# Patient Record
Sex: Male | Born: 1995 | Hispanic: No | Marital: Single | Smoking: Never smoker
Health system: Southern US, Community
[De-identification: ages and names within clinical notes are randomized; demographics above are authoritative.]

## PROBLEM LIST (undated history)

## (undated) DIAGNOSIS — F32A Depression, unspecified: Secondary | ICD-10-CM

## (undated) DIAGNOSIS — F419 Anxiety disorder, unspecified: Secondary | ICD-10-CM

## (undated) DIAGNOSIS — F913 Oppositional defiant disorder: Secondary | ICD-10-CM

## (undated) DIAGNOSIS — F329 Major depressive disorder, single episode, unspecified: Secondary | ICD-10-CM

## (undated) DIAGNOSIS — K589 Irritable bowel syndrome without diarrhea: Secondary | ICD-10-CM

## (undated) HISTORY — DX: Oppositional defiant disorder: F91.3

## (undated) HISTORY — DX: Major depressive disorder, single episode, unspecified: F32.9

## (undated) HISTORY — DX: Depression, unspecified: F32.A

## (undated) HISTORY — DX: Anxiety disorder, unspecified: F41.9

## (undated) HISTORY — DX: Irritable bowel syndrome, unspecified: K58.9

---
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
## 2014-03-27 ENCOUNTER — Ambulatory Visit (INDEPENDENT_AMBULATORY_CARE_PROVIDER_SITE_OTHER): Payer: BC Managed Care – PPO | Admitting: Internal Medicine

## 2014-03-27 VITALS — BP 120/82 | HR 68 | Temp 98.2°F | Resp 16 | Ht 72.0 in | Wt 182.1 lb

## 2014-03-27 DIAGNOSIS — G4700 Insomnia, unspecified: Secondary | ICD-10-CM

## 2014-03-27 DIAGNOSIS — F411 Generalized anxiety disorder: Secondary | ICD-10-CM

## 2014-03-27 MED ORDER — CLONAZEPAM 1 MG PO TABS: 1.0000 mg | ORAL_TABLET | Freq: Every evening | ORAL | Status: DC | PRN

## 2014-03-27 NOTE — Progress Notes (Signed)
   Subjective:    Patient ID: Marvin Gentry, male    DOB: 1996-02-06, 18 y.o.   MRN: [id number]  Anxiety Symptoms include nervous/anxious behavior.     Chief Complaint  Patient presents with  . Anxiety  . Irritable Bowel Syndrome   This chart was scribed for Marvin Siaobert Merridy Pascoe, MD by Andrew Auaven Small, ED Scribe. This patient was seen in room 11 and the patient's care was started at 1:29 PM.  HPI Comments: Marvin SersKevin Lietzke is a 18 y.o. male who [facility] complaining of recurrent IBS symptoms. Pt currently attends [employer] with a heavy, intensive workload.  He reports recurrent IBS symptoms such as abdominal pain and loose stools. Pt reports associated anxiety but is unsure if IBS symptoms are the cause of anxiety or if the anxiety is causing stomach pain. Pt states in the past he would exercise to relieve stress but does not have time for this. He has also tried to change his diet which consist of processed fast food but due to his schedule his unable to cook for himself. Pt reports trouble sleeping due to stress and stomach pains. Pt states not being able to sleep is effecting his grades and performance in class.  Pt states bentyl in the past gave mild relief to pain but reports medication gave him sweats. Pt states he has tried cognitive exercises at night to help sleep such as breathing a tapping his feet.  Past Medical History  Diagnosis Date  . Anxiety   . Oppositional defiant disorder   . Depression   . IBS (irritable bowel syndrome)    History reviewed. No pertinent past surgical history. Prior to Admission medications   Not on File   Review of Systems  Gastrointestinal: Positive for abdominal pain and diarrhea.  Psychiatric/Behavioral: Positive for sleep disturbance. The patient is nervous/anxious.    Objective:   Physical Exam  Nursing note and vitals reviewed. Constitutional: He is oriented to person, place, and time. He appears well-developed and  well-nourished. No distress.  HENT:  Head: Normocephalic and atraumatic.  Right Ear: External ear normal.  Left Ear: External ear normal.  Eyes: Conjunctivae and EOM are normal. Pupils are equal, round, and reactive to light.  Neck: Normal range of motion. Neck supple.  Cardiovascular: Normal rate, regular rhythm and normal heart sounds.   Pulmonary/Chest: Effort normal and breath sounds normal.  Musculoskeletal: Normal range of motion.  Neurological: He is alert and oriented to person, place, and time.  Skin: Skin is warm and dry.  Psychiatric: He has a normal mood and affect. His behavior is normal.    Assessment & Plan:    Insomnia - Plan: clonazePAM (KLONOPIN) 1 MG tablet  GAD (generalized anxiety disorder)  Meds ordered this encounter  Medications  . clonazePAM (KLONOPIN) 1 MG tablet    Sig: Take 1 tablet (1 mg total) by mouth at bedtime as needed for anxiety.    Dispense:  30 tablet    Refill:  2   He would like to use medication briefly and hopes to not be on daily medication He will continue cognitive behavioral techniques Followup in December during semester break

## 2014-07-11 ENCOUNTER — Ambulatory Visit (INDEPENDENT_AMBULATORY_CARE_PROVIDER_SITE_OTHER): Payer: BC Managed Care – PPO | Admitting: Family Medicine

## 2014-07-11 VITALS — BP 118/68 | HR 84 | Temp 98.1°F | Resp 18 | Ht 71.5 in | Wt 171.0 lb

## 2014-07-11 DIAGNOSIS — F411 Generalized anxiety disorder: Secondary | ICD-10-CM

## 2014-07-11 DIAGNOSIS — K589 Irritable bowel syndrome without diarrhea: Secondary | ICD-10-CM

## 2014-07-11 DIAGNOSIS — F341 Dysthymic disorder: Secondary | ICD-10-CM

## 2014-07-11 DIAGNOSIS — G4700 Insomnia, unspecified: Secondary | ICD-10-CM

## 2014-07-11 DIAGNOSIS — S161XXA Strain of muscle, fascia and tendon at neck level, initial encounter: Secondary | ICD-10-CM

## 2014-07-11 MED ORDER — CLONAZEPAM 1 MG PO TABS: 1.0000 mg | ORAL_TABLET | Freq: Every day | ORAL | Status: AC

## 2014-07-11 MED ORDER — TRAMADOL HCL 50 MG PO TABS: 50.0000 mg | ORAL_TABLET | Freq: Three times a day (TID) | ORAL | Status: AC | PRN

## 2014-07-11 MED ORDER — METHOCARBAMOL 500 MG PO TABS: 500.0000 mg | ORAL_TABLET | Freq: Four times a day (QID) | ORAL | Status: AC

## 2014-07-11 NOTE — Progress Notes (Signed)
Subjective: Patient was lifting weights yesterday, doing a Network engineermilitary press. He felt a pop in her right side of his neck and intense pain it is persisted. He couldn't rest well last night with it. Has not had any prior neck injuries. The pain does not radiate down the arm, but he is in the right side of the neck and into the trapezius. He feels best if he tilts keeps his head tilted to the left.  He is also due a refill on his clonazepam. He takes this for a long time per Dr. Merla Richesoolittle. The controlled substances policy was explained to the patient, and he will be given one month's worth refill at this time today.

## 2014-07-11 NOTE — Patient Instructions (Addendum)
[facility] Policy for Prescribing Controlled Substances (Revised 04/2012) 1. Prescriptions for controlled substances will be filled by ONE provider at [facility] with whom you have established and developed a plan for your care, including follow-up. 2. You are encouraged to schedule an appointment with your prescriber at our appointment center for follow-up visits whenever possible. 3. If you request a prescription for the controlled substance while at [facility] for an acute problem (with someone other than your regular prescriber), you MAY be given a ONE-TIME prescription for a 30-day supply of the controlled substance, to allow time for you to return to see your regular prescriber for additional prescriptions.  Take Robaxin 1 or 2 pills morning and afternoon, and 2 at bedtime for muscle relaxant  Continue taking either ibuprofen 600-800 mg 3 times daily or naproxen (Aleve) 2 pills twice daily for pain and inflammation  Take tramadol 1 every 6 or 8 hours if needed for more severe pain  Ice/heat to neck as discussed  Gentle motion of neck to keep it from tightening up  Return if worse at anytime   .

## 2014-08-10 ENCOUNTER — Ambulatory Visit: Payer: BC Managed Care – PPO | Admitting: Internal Medicine

## 2015-05-15 ENCOUNTER — Encounter: Payer: Self-pay | Admitting: Internal Medicine

## 2020-12-25 ENCOUNTER — Other Ambulatory Visit: Payer: Self-pay

## 2020-12-25 ENCOUNTER — Encounter (HOSPITAL_BASED_OUTPATIENT_CLINIC_OR_DEPARTMENT_OTHER): Payer: Self-pay | Admitting: Obstetrics and Gynecology

## 2020-12-25 ENCOUNTER — Emergency Department (HOSPITAL_BASED_OUTPATIENT_CLINIC_OR_DEPARTMENT_OTHER): Payer: Managed Care, Other (non HMO)

## 2020-12-25 ENCOUNTER — Ambulatory Visit (HOSPITAL_BASED_OUTPATIENT_CLINIC_OR_DEPARTMENT_OTHER)
Admission: EM | Admit: 2020-12-25 | Discharge: 2020-12-26 | Disposition: A | Discharge status: Home / Self Care | Payer: Managed Care, Other (non HMO) | Attending: General Surgery | Admitting: General Surgery

## 2020-12-25 DIAGNOSIS — Z20822 Contact with and (suspected) exposure to covid-19: Secondary | ICD-10-CM | POA: Diagnosis not present

## 2020-12-25 DIAGNOSIS — K3580 Unspecified acute appendicitis: Secondary | ICD-10-CM | POA: Insufficient documentation

## 2020-12-25 DIAGNOSIS — Z79899 Other long term (current) drug therapy: Secondary | ICD-10-CM | POA: Insufficient documentation

## 2020-12-25 LAB — COMPREHENSIVE METABOLIC PANEL
ALT: 47 U/L — ABNORMAL HIGH (ref 0–44)
AST: 26 U/L (ref 15–41)
Albumin: 4.5 g/dL (ref 3.5–5.0)
Alkaline Phosphatase: 53 U/L (ref 38–126)
Anion gap: 11 (ref 5–15)
BUN: 16 mg/dL (ref 6–20)
CO2: 26 mmol/L (ref 22–32)
Calcium: 9.8 mg/dL (ref 8.9–10.3)
Chloride: 102 mmol/L (ref 98–111)
Creatinine, Ser: 1.24 mg/dL (ref 0.61–1.24)
GFR, Estimated: >60 mL/min (ref >60)
Glucose, Bld: 91 mg/dL (ref 70–99)
Potassium: 3.6 mmol/L (ref 3.5–5.1)
Sodium: 139 mmol/L (ref 135–145)
Total Bilirubin: 0.6 mg/dL (ref 0.3–1.2)
Total Protein: 7.2 g/dL (ref 6.5–8.1)

## 2020-12-25 LAB — CBC
HCT: 48.9 % (ref 39.0–52.0)
Hemoglobin: 17 g/dL (ref 13.0–17.0)
MCH: 31.3 pg (ref 26.0–34.0)
MCHC: 34.8 g/dL (ref 30.0–36.0)
MCV: 89.9 fL (ref 80.0–100.0)
Platelets: 266 10*3/uL (ref 150–400)
RBC: 5.44 MIL/uL (ref 4.22–5.81)
RDW: 11.8 % (ref 11.5–15.5)
WBC: 8.8 10*3/uL (ref 4.0–10.5)
nRBC: 0 % (ref 0.0–0.2)

## 2020-12-25 LAB — URINALYSIS, ROUTINE W REFLEX MICROSCOPIC
Bilirubin Urine: NEGATIVE
Glucose, UA: NEGATIVE mg/dL
Hgb urine dipstick: NEGATIVE
Ketones, ur: 40 mg/dL — AB
Leukocytes,Ua: NEGATIVE
Nitrite: NEGATIVE
Specific Gravity, Urine: 1.029 (ref 1.005–1.030)
pH: 6 (ref 5.0–8.0)

## 2020-12-25 LAB — LIPASE, BLOOD: Lipase: 45 U/L (ref 11–51)

## 2020-12-25 MED ORDER — SODIUM CHLORIDE 0.9 % IV BOLUS
1000.0000 mL | Freq: Once | INTRAVENOUS | Status: AC
  Administered 2020-12-25: 1000 mL via INTRAVENOUS

## 2020-12-25 MED ORDER — IOHEXOL 300 MG/ML  SOLN
100.0000 mL | Freq: Once | INTRAMUSCULAR | Status: AC | PRN
  Administered 2020-12-25: 100 mL via INTRAVENOUS

## 2020-12-25 MED ORDER — KETOROLAC TROMETHAMINE 30 MG/ML IJ SOLN
30.0000 mg | Freq: Once | INTRAMUSCULAR | Status: AC
  Administered 2020-12-25: 30 mg via INTRAVENOUS
  Filled 2020-12-25: qty 1

## 2020-12-25 NOTE — ED Triage Notes (Signed)
Patient reports to the ER from walk in clinic for r/o appendicitis.

## 2020-12-25 NOTE — ED Provider Notes (Signed)
[facility] EMERGENCY DEPT Provider Note   CSN: [id number] Arrival date & time: 12/25/20  1742     History Chief Complaint  Patient presents with   Abdominal Pain    Marvin Gentry is a 25 y.o. male.  Patient is a 25 year old male with history of irritable bowel.  Patient presents today for evaluation of abdominal pain.  This started 3 nights ago.  Pain began as what he describes as a "gas pain" to the right side of the abdomen that has not resolved.  He describes constant pain to the right lower quadrant that is worse when he pushes on the area and changes position.  He denies any fevers or chills.  He denies any bowel or bladder complaints.  He was seen at the doctor's office this morning, then sent here for rule out of appendicitis.  The history is provided by the patient.  Abdominal Pain Pain location:  RLQ Pain quality: aching   Pain radiates to:  Does not radiate Pain severity:  Moderate Onset quality:  Sudden Duration:  3 days Timing:  Constant Progression:  Worsening Chronicity:  New Relieved by:  Nothing Worsened by:  Movement, palpation and position changes     Past Medical History:  Diagnosis Date   Anxiety    Depression    IBS (irritable bowel syndrome)    Oppositional defiant disorder     Patient Active Problem List   Diagnosis Date Noted   IBS (irritable bowel syndrome) 06/21/2012   GAD (generalized anxiety disorder) 07/15/2011   Dysthymic disorder 07/15/2011    History reviewed. No pertinent surgical history.     Family History  Problem Relation Age of Onset   Anxiety disorder Father    Diabetes Paternal Grandmother     Social History   Tobacco Use   Smoking status: Never   Smokeless tobacco: Never  Vaping Use   Vaping Use: Never used  Substance Use Topics   Alcohol use: No   Drug use: No    Home Medications Prior to Admission medications   Medication Sig Start Date End Date Taking? Authorizing Provider  clonazePAM  (KLONOPIN) 1 MG tablet Take 1 tablet (1 mg total) by mouth daily. 07/11/14   Peyton Najjar, MD  methocarbamol (ROBAXIN) 500 MG tablet Take 1 tablet (500 mg total) by mouth 4 (four) times daily. 07/11/14   Peyton Najjar, MD  traMADol (ULTRAM) 50 MG tablet Take 1 tablet (50 mg total) by mouth every 8 (eight) hours as needed. 07/11/14   Peyton Najjar, MD    Allergies    Patient has no known allergies.  Review of Systems   Review of Systems  Gastrointestinal:  Positive for abdominal pain.  All other systems reviewed and are negative.  Physical Exam Updated Vital Signs BP (!) 143/89   Pulse 66   Temp 98.2 F (36.8 C)   Resp 18   Ht 6\' 2"  (1.88 m)   Wt 113.4 kg   SpO2 99%   BMI 32.10 kg/m   Physical Exam Vitals and nursing note reviewed.  Constitutional:      General: He is not in acute distress.    Appearance: He is well-developed. He is not diaphoretic.  HENT:     Head: Normocephalic and atraumatic.  Cardiovascular:     Rate and Rhythm: Normal rate and regular rhythm.     Heart sounds: No murmur heard.   No friction rub.  Pulmonary:     Effort: Pulmonary effort is  normal. No respiratory distress.     Breath sounds: Normal breath sounds. No wheezing or rales.  Abdominal:     General: Bowel sounds are normal. There is no distension.     Palpations: Abdomen is soft.     Tenderness: There is abdominal tenderness in the right lower quadrant. There is no right CVA tenderness, left CVA tenderness, guarding or rebound.  Musculoskeletal:        General: Normal range of motion.     Cervical back: Normal range of motion and neck supple.  Skin:    General: Skin is warm and dry.  Neurological:     Mental Status: He is alert and oriented to person, place, and time.     Coordination: Coordination normal.    ED Results / Procedures / Treatments   Labs (all labs ordered are listed, but only abnormal results are displayed) Labs Reviewed  COMPREHENSIVE METABOLIC PANEL -  Abnormal; Notable for the following components:      Result Value   ALT 47 (*)    All other components within normal limits  URINALYSIS, ROUTINE W REFLEX MICROSCOPIC - Abnormal; Notable for the following components:   Ketones, ur 40 (*)    Protein, ur TRACE (*)    All other components within normal limits  LIPASE, BLOOD  CBC    EKG None  Radiology No results found.  Procedures Procedures   Medications Ordered in ED Medications  ketorolac (TORADOL) 30 MG/ML injection 30 mg (has no administration in time range)  sodium chloride 0.9 % bolus 1,000 mL (has no administration in time range)    ED Course  I have reviewed the triage vital signs and the nursing notes.  Pertinent labs & imaging results that were available during my care of the patient were reviewed by me and considered in my medical decision making (see chart for details).    MDM Rules/Calculators/A&P  Patient sent for evaluation of right lower quadrant pain and concerns of appendicitis.  He has no white count or fever, but CT scan does show what appears to be acute, developing appendicitis.  Care discussed with Dr. Andrey Campanile from general surgery.  Patient to be transferred to [facility] for further evaluation.  Rocephin and Flagyl given.  Final Clinical Impression(s) / ED Diagnoses Final diagnoses:  None    Rx / DC Orders ED Discharge Orders     None        Geoffery Lyons, MD 12/26/20 [phone]

## 2020-12-26 ENCOUNTER — Emergency Department (HOSPITAL_COMMUNITY): Payer: Managed Care, Other (non HMO) | Admitting: Certified Registered"

## 2020-12-26 ENCOUNTER — Encounter (HOSPITAL_COMMUNITY)
Admission: EM | Disposition: A | Discharge status: Home / Self Care | Payer: Self-pay | Source: Home / Self Care | Attending: Emergency Medicine

## 2020-12-26 ENCOUNTER — Encounter (HOSPITAL_COMMUNITY): Payer: Self-pay

## 2020-12-26 DIAGNOSIS — K3580 Unspecified acute appendicitis: Secondary | ICD-10-CM | POA: Diagnosis present

## 2020-12-26 DIAGNOSIS — Z79899 Other long term (current) drug therapy: Secondary | ICD-10-CM | POA: Diagnosis not present

## 2020-12-26 DIAGNOSIS — Z20822 Contact with and (suspected) exposure to covid-19: Secondary | ICD-10-CM | POA: Diagnosis not present

## 2020-12-26 HISTORY — PX: LAPAROSCOPIC APPENDECTOMY: SHX408

## 2020-12-26 LAB — RESP PANEL BY RT-PCR (FLU A&B, COVID) ARPGX2
Influenza A by PCR: NEGATIVE
Influenza B by PCR: NEGATIVE
SARS Coronavirus 2 by RT PCR: NEGATIVE

## 2020-12-26 SURGERY — APPENDECTOMY, LAPAROSCOPIC
Anesthesia: General | Site: Abdomen

## 2020-12-26 MED ORDER — SUFENTANIL CITRATE 50 MCG/ML IV SOLN
INTRAVENOUS | Status: DC | PRN
  Administered 2020-12-26: 10 ug via INTRAVENOUS

## 2020-12-26 MED ORDER — OXYCODONE HCL 5 MG PO TABS
5.0000 mg | ORAL_TABLET | Freq: Once | ORAL | Status: AC | PRN
  Administered 2020-12-26: 5 mg via ORAL

## 2020-12-26 MED ORDER — ACETAMINOPHEN 10 MG/ML IV SOLN
INTRAVENOUS | Status: AC
  Filled 2020-12-26: qty 100

## 2020-12-26 MED ORDER — MIDAZOLAM HCL 2 MG/2ML IJ SOLN
INTRAMUSCULAR | Status: DC | PRN
  Administered 2020-12-26: 2 mg via INTRAVENOUS

## 2020-12-26 MED ORDER — ONDANSETRON HCL 4 MG/2ML IJ SOLN
INTRAMUSCULAR | Status: AC
  Filled 2020-12-26: qty 2

## 2020-12-26 MED ORDER — ACETAMINOPHEN 325 MG PO TABS: 650.0000 mg | ORAL_TABLET | ORAL | Status: DC | PRN

## 2020-12-26 MED ORDER — ROCURONIUM BROMIDE 10 MG/ML (PF) SYRINGE
PREFILLED_SYRINGE | INTRAVENOUS | Status: DC | PRN
  Administered 2020-12-26: 70 mg via INTRAVENOUS

## 2020-12-26 MED ORDER — PROPOFOL 10 MG/ML IV BOLUS
INTRAVENOUS | Status: DC | PRN
  Administered 2020-12-26: 200 mg via INTRAVENOUS

## 2020-12-26 MED ORDER — SODIUM CHLORIDE 0.9 % IV SOLN
1.0000 g | Freq: Once | INTRAVENOUS | Status: AC
  Administered 2020-12-26: 1 g via INTRAVENOUS
  Filled 2020-12-26: qty 10

## 2020-12-26 MED ORDER — AMISULPRIDE (ANTIEMETIC) 5 MG/2ML IV SOLN: 10.0000 mg | Freq: Once | INTRAVENOUS | Status: DC | PRN

## 2020-12-26 MED ORDER — PROPOFOL 10 MG/ML IV BOLUS
INTRAVENOUS | Status: AC
  Filled 2020-12-26: qty 20

## 2020-12-26 MED ORDER — MIDAZOLAM HCL 2 MG/2ML IJ SOLN
INTRAMUSCULAR | Status: AC
  Filled 2020-12-26: qty 2

## 2020-12-26 MED ORDER — OXYCODONE HCL 5 MG PO TABS: 5.0000 mg | ORAL_TABLET | Freq: Four times a day (QID) | ORAL | 0 refills | Status: AC | PRN

## 2020-12-26 MED ORDER — DEXAMETHASONE SODIUM PHOSPHATE 10 MG/ML IJ SOLN
INTRAMUSCULAR | Status: DC | PRN
  Administered 2020-12-26: 4 mg via INTRAVENOUS

## 2020-12-26 MED ORDER — SODIUM CHLORIDE 0.9 % IV SOLN: 250.0000 mL | INTRAVENOUS | Status: DC | PRN

## 2020-12-26 MED ORDER — SODIUM CHLORIDE 0.9 % IR SOLN
Status: DC | PRN
  Administered 2020-12-26: 1000 mL

## 2020-12-26 MED ORDER — CEFAZOLIN SODIUM-DEXTROSE 2-3 GM-%(50ML) IV SOLR
INTRAVENOUS | Status: DC | PRN
  Administered 2020-12-26: 2 g via INTRAVENOUS

## 2020-12-26 MED ORDER — ORAL CARE MOUTH RINSE: 15.0000 mL | Freq: Once | OROMUCOSAL | Status: AC

## 2020-12-26 MED ORDER — SUGAMMADEX SODIUM 200 MG/2ML IV SOLN
INTRAVENOUS | Status: DC | PRN
  Administered 2020-12-26: 200 mg via INTRAVENOUS

## 2020-12-26 MED ORDER — LACTATED RINGERS IV SOLN: INTRAVENOUS | Status: DC | PRN

## 2020-12-26 MED ORDER — OXYCODONE HCL 5 MG PO TABS
ORAL_TABLET | ORAL | Status: AC
  Filled 2020-12-26: qty 1

## 2020-12-26 MED ORDER — ONDANSETRON HCL 4 MG/2ML IJ SOLN
4.0000 mg | Freq: Once | INTRAMUSCULAR | Status: DC | PRN
Start: 2020-12-26 — End: 2020-12-26

## 2020-12-26 MED ORDER — FENTANYL CITRATE (PF) 100 MCG/2ML IJ SOLN
25.0000 ug | INTRAMUSCULAR | Status: DC | PRN
  Administered 2020-12-26: 50 ug via INTRAVENOUS

## 2020-12-26 MED ORDER — DEXAMETHASONE SODIUM PHOSPHATE 10 MG/ML IJ SOLN
INTRAMUSCULAR | Status: AC
  Filled 2020-12-26: qty 1

## 2020-12-26 MED ORDER — ACETAMINOPHEN 650 MG RE SUPP
650.0000 mg | RECTAL | Status: DC | PRN
Start: 2020-12-26 — End: 2020-12-26

## 2020-12-26 MED ORDER — CHLORHEXIDINE GLUCONATE 0.12 % MT SOLN
15.0000 mL | Freq: Once | OROMUCOSAL | Status: AC
  Administered 2020-12-26: 15 mL via OROMUCOSAL
  Filled 2020-12-26: qty 15

## 2020-12-26 MED ORDER — SUFENTANIL CITRATE 50 MCG/ML IV SOLN
INTRAVENOUS | Status: AC
  Filled 2020-12-26: qty 1

## 2020-12-26 MED ORDER — METRONIDAZOLE 500 MG/100ML IV SOLN
500.0000 mg | Freq: Once | INTRAVENOUS | Status: AC
  Administered 2020-12-26: 500 mg via INTRAVENOUS
  Filled 2020-12-26: qty 100

## 2020-12-26 MED ORDER — OXYCODONE HCL 5 MG/5ML PO SOLN: 5.0000 mg | Freq: Once | ORAL | Status: AC | PRN

## 2020-12-26 MED ORDER — LORAZEPAM 2 MG/ML IJ SOLN
1.0000 mg | Freq: Once | INTRAMUSCULAR | Status: AC
  Administered 2020-12-26: 1 mg via INTRAVENOUS
  Filled 2020-12-26: qty 1

## 2020-12-26 MED ORDER — ACETAMINOPHEN 10 MG/ML IV SOLN
INTRAVENOUS | Status: DC | PRN
  Administered 2020-12-26: 1000 mg via INTRAVENOUS

## 2020-12-26 MED ORDER — BUPIVACAINE-EPINEPHRINE (PF) 0.25% -1:200000 IJ SOLN
INTRAMUSCULAR | Status: AC
  Filled 2020-12-26: qty 30

## 2020-12-26 MED ORDER — 0.9 % SODIUM CHLORIDE (POUR BTL) OPTIME
TOPICAL | Status: DC | PRN
  Administered 2020-12-26: 1000 mL

## 2020-12-26 MED ORDER — ONDANSETRON HCL 4 MG/2ML IJ SOLN
INTRAMUSCULAR | Status: DC | PRN
  Administered 2020-12-26: 4 mg via INTRAVENOUS

## 2020-12-26 MED ORDER — SODIUM CHLORIDE 0.9% FLUSH: 3.0000 mL | INTRAVENOUS | Status: DC | PRN

## 2020-12-26 MED ORDER — OXYCODONE HCL 5 MG PO TABS: 5.0000 mg | ORAL_TABLET | ORAL | Status: DC | PRN

## 2020-12-26 MED ORDER — LIDOCAINE HCL (CARDIAC) PF 100 MG/5ML IV SOSY
PREFILLED_SYRINGE | INTRAVENOUS | Status: DC | PRN
  Administered 2020-12-26: 100 mg via INTRATRACHEAL

## 2020-12-26 MED ORDER — LACTATED RINGERS IV SOLN: INTRAVENOUS | Status: DC

## 2020-12-26 MED ORDER — FENTANYL CITRATE (PF) 100 MCG/2ML IJ SOLN
INTRAMUSCULAR | Status: AC
  Filled 2020-12-26: qty 2

## 2020-12-26 MED ORDER — CEFAZOLIN SODIUM-DEXTROSE 2-4 GM/100ML-% IV SOLN
INTRAVENOUS | Status: AC
  Filled 2020-12-26: qty 100

## 2020-12-26 MED ORDER — BUPIVACAINE-EPINEPHRINE 0.25% -1:200000 IJ SOLN
INTRAMUSCULAR | Status: DC | PRN
  Administered 2020-12-26: 5 mL

## 2020-12-26 SURGICAL SUPPLY — 41 items
ADH SKN CLS APL DERMABOND .7 (GAUZE/BANDAGES/DRESSINGS) ×1
APL PRP STRL LF DISP 70% ISPRP (MISCELLANEOUS) ×1
BAG COUNTER SPONGE SURGICOUNT (BAG) ×2 IMPLANT
BAG SPEC RTRVL 10 TROC 200 (ENDOMECHANICALS) ×1
BAG SPNG CNTER NS LX DISP (BAG) ×1
CANISTER SUCT 3000ML PPV (MISCELLANEOUS) ×2 IMPLANT
CHLORAPREP W/TINT 26 (MISCELLANEOUS) ×2 IMPLANT
COVER SURGICAL LIGHT HANDLE (MISCELLANEOUS) ×2 IMPLANT
CUTTER FLEX LINEAR 45M (STAPLE) ×2 IMPLANT
DERMABOND ADVANCED (GAUZE/BANDAGES/DRESSINGS) ×1
DERMABOND ADVANCED .7 DNX12 (GAUZE/BANDAGES/DRESSINGS) ×1 IMPLANT
ELECT REM PT RETURN 9FT ADLT (ELECTROSURGICAL) ×2
ELECTRODE REM PT RTRN 9FT ADLT (ELECTROSURGICAL) ×1 IMPLANT
GLOVE SURG ENC MOIS LTX SZ7 (GLOVE) ×2 IMPLANT
GLOVE SURG UNDER POLY LF SZ7.5 (GLOVE) ×2 IMPLANT
GOWN STRL REUS W/ TWL LRG LVL3 (GOWN DISPOSABLE) ×3 IMPLANT
GOWN STRL REUS W/TWL LRG LVL3 (GOWN DISPOSABLE) ×6
GRASPER SUT TROCAR 14GX15 (MISCELLANEOUS) ×2 IMPLANT
KIT BASIN OR (CUSTOM PROCEDURE TRAY) ×2 IMPLANT
KIT TURNOVER KIT B (KITS) ×2 IMPLANT
NS IRRIG 1000ML POUR BTL (IV SOLUTION) ×2 IMPLANT
PAD ARMBOARD 7.5X6 YLW CONV (MISCELLANEOUS) ×4 IMPLANT
POUCH RETRIEVAL ECOSAC 10 (ENDOMECHANICALS) ×1 IMPLANT
POUCH RETRIEVAL ECOSAC 10MM (ENDOMECHANICALS) ×2
RELOAD STAPLE 45 3.5 BLU ETS (ENDOMECHANICALS) IMPLANT
RELOAD STAPLE TA45 3.5 REG BLU (ENDOMECHANICALS) ×2 IMPLANT
SCISSORS LAP 5X35 DISP (ENDOMECHANICALS) ×1 IMPLANT
SET IRRIG TUBING LAPAROSCOPIC (IRRIGATION / IRRIGATOR) ×2 IMPLANT
SET TUBE SMOKE EVAC HIGH FLOW (TUBING) ×2 IMPLANT
SHEARS HARMONIC ACE PLUS 36CM (ENDOMECHANICALS) ×2 IMPLANT
SLEEVE ENDOPATH XCEL 5M (ENDOMECHANICALS) ×2 IMPLANT
SPECIMEN JAR SMALL (MISCELLANEOUS) ×2 IMPLANT
STRIP CLOSURE SKIN 1/2X4 (GAUZE/BANDAGES/DRESSINGS) ×2 IMPLANT
SUT MNCRL AB 4-0 PS2 18 (SUTURE) ×2 IMPLANT
SUT VICRYL 0 UR6 27IN ABS (SUTURE) ×2 IMPLANT
TOWEL GREEN STERILE (TOWEL DISPOSABLE) ×2 IMPLANT
TOWEL GREEN STERILE FF (TOWEL DISPOSABLE) ×2 IMPLANT
TRAY LAPAROSCOPIC MC (CUSTOM PROCEDURE TRAY) ×2 IMPLANT
TROCAR XCEL BLUNT TIP 100MML (ENDOMECHANICALS) ×2 IMPLANT
TROCAR XCEL NON-BLD 5MMX100MML (ENDOMECHANICALS) ×2 IMPLANT
WATER STERILE IRR 1000ML POUR (IV SOLUTION) ×2 IMPLANT

## 2020-12-26 NOTE — Transfer of Care (Signed)
Immediate Anesthesia Transfer of Care Note  Patient: Marvin Gentry  Procedure(s) Performed: APPENDECTOMY LAPAROSCOPIC (Abdomen)  Patient Location: PACU  Anesthesia Type:General  Level of Consciousness: awake, alert , oriented and patient cooperative  Airway & Oxygen Therapy: Patient Spontanous Breathing and Patient connected to nasal cannula oxygen  Post-op Assessment: Report given to RN, Post -op Vital signs reviewed and stable and Patient moving all extremities X 4  Post vital signs: Reviewed and stable  Last Vitals:  Vitals Value Taken Time  BP 134/75 12/26/20 1224  Temp    Pulse 82 12/26/20 1227  Resp 12 12/26/20 1227  SpO2 100 % 12/26/20 1227  Vitals shown include unvalidated device data.  Last Pain:  Vitals:   12/26/20 0956  TempSrc: Oral  PainSc:          Complications: No notable events documented.

## 2020-12-26 NOTE — Anesthesia Preprocedure Evaluation (Signed)
Anesthesia Evaluation  Patient identified by MRN, date of birth, ID band Patient awake    Reviewed: Allergy & Precautions, NPO status , Patient's Chart, lab work & pertinent test results  History of Anesthesia Complications Negative for: history of anesthetic complications  Airway Mallampati: II  TM Distance: >3 FB Neck ROM: Full    Dental  (+) Teeth Intact, Dental Advisory Given   Pulmonary neg pulmonary ROS,    Pulmonary exam normal        Cardiovascular negative cardio ROS Normal cardiovascular exam     Neuro/Psych Anxiety Depression negative neurological ROS     GI/Hepatic Neg liver ROS, Acute appendicitis   Endo/Other  negative endocrine ROS  Renal/GU negative Renal ROS  negative genitourinary   Musculoskeletal negative musculoskeletal ROS (+)   Abdominal   Peds  Hematology negative hematology ROS (+)   Anesthesia Other Findings   Reproductive/Obstetrics                             Anesthesia Physical Anesthesia Plan  ASA: 2 and emergent  Anesthesia Plan: General   Post-op Pain Management:    Induction: Intravenous, Rapid sequence and Cricoid pressure planned  PONV Risk Score and Plan: 3 and Ondansetron, Dexamethasone, Treatment may vary due to age or medical condition and Midazolam  Airway Management Planned: Oral ETT  Additional Equipment: None  Intra-op Plan:   Post-operative Plan: Extubation in OR  Informed Consent: I have reviewed the patients History and Physical, chart, labs and discussed the procedure including the risks, benefits and alternatives for the proposed anesthesia with the patient or authorized representative who has indicated his/her understanding and acceptance.     Dental advisory given  Plan Discussed with:   Anesthesia Plan Comments:         Anesthesia Quick Evaluation

## 2020-12-26 NOTE — Anesthesia Procedure Notes (Addendum)
Procedure Name: Intubation Date/Time: 12/26/2020 11:18 AM Performed by: Melina Schools, CRNA Pre-anesthesia Checklist: Patient identified, Emergency Drugs available, Suction available and Patient being monitored Patient Re-evaluated:Patient Re-evaluated prior to induction Oxygen Delivery Method: Circle System Utilized Preoxygenation: Pre-oxygenation with 100% oxygen Induction Type: IV induction Ventilation: Mask ventilation without difficulty Laryngoscope Size: Miller and 3 Grade View: Grade I Tube type: Oral Tube size: 7.5 mm Number of attempts: 1 Airway Equipment and Method: Stylet and Oral airway Placement Confirmation: ETT inserted through vocal cords under direct vision, positive ETCO2 and breath sounds checked- equal and bilateral Secured at: 22 cm Tube secured with: Tape Dental Injury: Teeth and Oropharynx as per pre-operative assessment

## 2020-12-26 NOTE — Anesthesia Postprocedure Evaluation (Signed)
Anesthesia Post Note  Patient: Marvin Gentry  Procedure(s) Performed: APPENDECTOMY LAPAROSCOPIC (Abdomen)     Patient location during evaluation: PACU Anesthesia Type: General Level of consciousness: awake and alert Pain management: pain level controlled Vital Signs Assessment: post-procedure vital signs reviewed and stable Respiratory status: spontaneous breathing, nonlabored ventilation and respiratory function stable Cardiovascular status: blood pressure returned to baseline and stable Postop Assessment: no apparent nausea or vomiting Anesthetic complications: no   No notable events documented.  Last Vitals:  Vitals:   12/26/20 1240 12/26/20 1255  BP: 126/76 128/71  Pulse: 83 76  Resp: 18 15  Temp:  36.6 C  SpO2: 99% 98%    Last Pain:  Vitals:   12/26/20 1255  TempSrc:   PainSc: 4                  Lucretia Kern

## 2020-12-26 NOTE — Op Note (Signed)
Preoperative diagnosis: acute appendicitis Postoperative diagnosis: Acute suppurative appendicitis Procedure: Laparoscopic appendectomy Surgeon: Dr. Harden Mo Anesthesia: General Estimated blood loss: Minimal Complications: None Drains: None Specimens: Appendix to pathology Sponge and count was correct completion Disposition recovery stable condition  Indications: This a 25 year old otherwise healthy male who presents with right lower quadrant pain, right lower quadrant tenderness, and a CT scan consistent with appendicitis.  I discussed options and we elected to proceed with laparoscopic appendectomy.  Procedure: After informed consent was obtained the patient was given antibiotics.  SCDs were placed.  He was placed under general anesthesia without complication.  He was prepped and draped in the standard sterile surgical fashion.  A surgical timeout was then performed.  I have treated Marcaine below his umbilicus.  I then made a vertical incision.  I grasped the fascia incised this sharply.  The peritoneum was entered bluntly.  I then placed a 0 Vicryl pursestring suture through the fascia.  I inserted a Hassan trocar and insufflated the abdomen to 15 mmHg pressure.  I then inserted 2 5 mm trochars in the suprapubic region and left lower quadrant.  I then was able to identify the appendix.  The terminal ileum and cecum were both identified.  The appendix did have acute appendicitis.  I then used the harmonic scalpel to release it from the sidewall.  I took the appendiceal mesentery with the harmonic scalpel.  I then got to the base and clearly visualize the cecum.  I used a GIA stapler to divide this at the base.  This was placed in a retrieval bag and removed from the abdomen.  Hemostasis was observed.  I then removed my Hassan trocar and tied my pursestring down.  I did place 2 additional 0 Vicryl sutures to completely obliterate the umbilical defect.  I then desufflated the abdomen and  remove the remaining trochars.  I closed these with 4-0 Monocryl and glue.  He tolerated this well was extubated transferred recovery stable.

## 2020-12-26 NOTE — Discharge Instructions (Signed)
[facility] LAPAROSCOPIC SURGERY: POST OP INSTRUCTIONS  Always review your discharge instruction sheet given to you by the facility where your surgery was performed. IF YOU HAVE DISABILITY OR FAMILY LEAVE FORMS, YOU MUST BRING THEM TO THE OFFICE FOR PROCESSING.   DO NOT GIVE THEM TO YOUR DOCTOR.  A prescription for pain medication may be given to you upon discharge.  Take your pain medication as prescribed, if needed.  If narcotic pain medicine is not needed, then you may take acetaminophen (Tylenol), naprosyn (Alleve), or ibuprofen (Advil) as needed. Take your usually prescribed medications unless otherwise directed. If you need a refill on your pain medication, please contact your pharmacy.  They will contact our office to request authorization. Prescriptions will not be filled after 5pm or on week-ends. You should follow a light diet the first few days after arrival home, such as soup and crackers, etc.  Be sure to include lots of fluids daily. Most patients will experience some swelling and bruising in the area of the incisions.  Ice packs will help.  Swelling and bruising can take several days to resolve.  It is common to experience some constipation if taking pain medication after surgery.  Increasing fluid intake and taking a stool softener (such as Colace) will usually help or prevent this problem from occurring.  A mild laxative (Milk of Magnesia or Miralax) should be taken according to package instructions if there are no bowel movements after 48 hours. Unless discharge instructions indicate otherwise, you may remove your bandages 48 hours after surgery, and you may shower at that time.  You may have steri-strips (small skin tapes) in place directly over the incision.  These strips should be left on the skin for 7-10 days.  If your surgeon used skin glue on the incision, you may shower in 24 hours.  The glue will flake off over the next  2-3 weeks.  Any sutures or staples will be removed at the office during your follow-up visit. ACTIVITIES:  You may resume regular (light) daily activities beginning the next day--such as daily self-care, walking, climbing stairs--gradually increasing activities as tolerated.  You may have sexual intercourse when it is comfortable.  Refrain from any heavy lifting or straining until approved by your doctor. You may drive when you are no longer taking prescription pain medication, you can comfortably wear a seatbelt, and you can safely maneuver your car and apply brakes. RETURN TO WORK:  __________________________________________________________ You should see your doctor in the office for a follow-up appointment approximately 2-3 weeks after your surgery.  Make sure that you call for this appointment within a day or two after you arrive home to insure a convenient appointment time. OTHER INSTRUCTIONS: __________________________________________________________________________________________________________________________ __________________________________________________________________________________________________________________________ WHEN TO CALL YOUR DOCTOR: Fever over 101.0 Inability to urinate Continued bleeding from incision. Increased pain, redness, or drainage from the incision. Increasing abdominal pain  The clinic staff is available to answer your questions during regular business hours.  Please don't hesitate to call and ask to speak to one of the nurses for clinical concerns.  If you have a medical emergency, go to the nearest emergency room or call 911.  A surgeon from [facility] is always on call at the hospital. [address], [address], [place], [place]  [address] ? [address] [address], [place],    [address] [phone] ? [phone] ? FAX [phone] Web site: [email or web address]  

## 2020-12-26 NOTE — H&P (Signed)
Marvin Gentry is an 25 y.o. male.   Chief Complaint: ab pain HPI: 51 yom with rlq abdominal pain that occurred last Friday night. Has not gone away, nothing making it better at home. No n/v.  Anorexia. No fevers. Having bowel function.  He has history of ibs and has endoscopies previously. Wbc is normal.  Ct shows a 1 cm appendix with some stranding present. He was transferred here for consideration of appendectomy.   Past Medical History:  Diagnosis Date   Anxiety    Depression    IBS (irritable bowel syndrome)    Oppositional defiant disorder     History reviewed. No pertinent surgical history.  Family History  Problem Relation Age of Onset   Anxiety disorder Father    Diabetes Paternal Grandmother    Social History:  reports that he has never smoked. He has never used smokeless tobacco. He reports that he does not drink alcohol and does not use drugs.  Allergies: No Known Allergies  Medications Prior to Admission  Medication Sig Dispense Refill   clonazePAM (KLONOPIN) 1 MG tablet Take 1 tablet (1 mg total) by mouth daily. 30 tablet 0   methocarbamol (ROBAXIN) 500 MG tablet Take 1 tablet (500 mg total) by mouth 4 (four) times daily. 40 tablet 0   traMADol (ULTRAM) 50 MG tablet Take 1 tablet (50 mg total) by mouth every 8 (eight) hours as needed. 20 tablet 0    Results for orders placed or performed during the hospital encounter of 12/25/20 (from the past 48 hour(s))  Urinalysis, Routine w reflex microscopic Urine, Clean Catch     Status: Abnormal   Collection Time: 12/25/20  6:21 PM  Result Value Ref Range   Color, Urine YELLOW YELLOW   APPearance CLEAR CLEAR   Specific Gravity, Urine 1.029 1.005 - 1.030   pH 6.0 5.0 - 8.0   Glucose, UA NEGATIVE NEGATIVE mg/dL   Hgb urine dipstick NEGATIVE NEGATIVE   Bilirubin Urine NEGATIVE NEGATIVE   Ketones, ur 40 (A) NEGATIVE mg/dL   Protein, ur TRACE (A) NEGATIVE mg/dL   Nitrite NEGATIVE NEGATIVE   Leukocytes,Ua NEGATIVE NEGATIVE     Comment: Performed at [facility], [address] [place], [place] [address]  Lipase, blood     Status: None   Collection Time: 12/25/20  6:22 PM  Result Value Ref Range   Lipase 45 11 - 51 U/L    Comment: Performed at [facility], [address] [place], [place] [address]  Comprehensive metabolic panel     Status: Abnormal   Collection Time: 12/25/20  6:22 PM  Result Value Ref Range   Sodium 139 135 - 145 mmol/L   Potassium 3.6 3.5 - 5.1 mmol/L   Chloride 102 98 - 111 mmol/L   CO2 26 22 - 32 mmol/L   Glucose, Bld 91 70 - 99 mg/dL    Comment: Glucose reference range applies only to samples taken after fasting for at least 8 hours.   BUN 16 6 - 20 mg/dL   Creatinine, Ser 1.76 0.61 - 1.24 mg/dL   Calcium 9.8 8.9 - 16.0 mg/dL   Total Protein 7.2 6.5 - 8.1 g/dL   Albumin 4.5 3.5 - 5.0 g/dL   AST 26 15 - 41 U/L   ALT 47 (H) 0 - 44 U/L   Alkaline Phosphatase 53 38 - 126 U/L   Total Bilirubin 0.6 0.3 - 1.2 mg/dL   GFR, Estimated >73 >71 mL/min    Comment: (  NOTE) Calculated using the CKD-EPI Creatinine Equation (2021)    Anion gap 11 5 - 15    Comment: Performed at [facility], [address], [place], [place] [address]  CBC     Status: None   Collection Time: 12/25/20  6:22 PM  Result Value Ref Range   WBC 8.8 4.0 - 10.5 K/uL   RBC 5.44 4.22 - 5.81 MIL/uL   Hemoglobin 17.0 13.0 - 17.0 g/dL   HCT 70.9 62.8 - 36.6 %   MCV 89.9 80.0 - 100.0 fL   MCH 31.3 26.0 - 34.0 pg   MCHC 34.8 30.0 - 36.0 g/dL   RDW 29.4 76.5 - 46.5 %   Platelets 266 150 - 400 K/uL   nRBC 0.0 0.0 - 0.2 %    Comment: Performed at [facility], [address] [place], [place] [address]  Resp Panel by RT-PCR (Flu A&B, Covid) Nasopharyngeal Swab     Status: None   Collection Time: 12/26/20  2:53 AM   Specimen: Nasopharyngeal Swab; Nasopharyngeal(NP) swabs in vial transport medium  Result Value Ref Range   SARS Coronavirus 2 by  RT PCR NEGATIVE NEGATIVE    Comment: (NOTE) SARS-CoV-2 target nucleic acids are NOT DETECTED.  The SARS-CoV-2 RNA is generally detectable in upper respiratory specimens during the acute phase of infection. The lowest concentration of SARS-CoV-2 viral copies this assay can detect is 138 copies/mL. A negative result does not preclude SARS-Cov-2 infection and should not be used as the sole basis for treatment or other patient management decisions. A negative result may occur with  improper specimen collection/handling, submission of specimen other than nasopharyngeal swab, presence of viral mutation(s) within the areas targeted by this assay, and inadequate number of viral copies(<138 copies/mL). A negative result must be combined with clinical observations, patient history, and epidemiological information. The expected result is Negative.  Fact Sheet for Patients:  BloggerCourse.com  Fact Sheet for Healthcare Providers:  SeriousBroker.it  This test is no t yet approved or cleared by the [place] FDA and  has been authorized for detection and/or diagnosis of SARS-CoV-2 by FDA under an Emergency Use Authorization (EUA). This EUA will remain  in effect (meaning this test can be used) for the duration of the COVID-19 declaration under Section 564(b)(1) of the Act, 21 U.S.C.section 360bbb-3(b)(1), unless the authorization is terminated  or revoked sooner.       Influenza A by PCR NEGATIVE NEGATIVE   Influenza B by PCR NEGATIVE NEGATIVE    Comment: (NOTE) The Xpert Xpress SARS-CoV-2/FLU/RSV plus assay is intended as an aid in the diagnosis of influenza from Nasopharyngeal swab specimens and should not be used as a sole basis for treatment. Nasal washings and aspirates are unacceptable for Xpert Xpress SARS-CoV-2/FLU/RSV testing.  Fact Sheet for Patients: BloggerCourse.com  Fact Sheet for Healthcare  Providers: SeriousBroker.it  This test is not yet approved or cleared by the [place] FDA and has been authorized for detection and/or diagnosis of SARS-CoV-2 by FDA under an Emergency Use Authorization (EUA). This EUA will remain in effect (meaning this test can be used) for the duration of the COVID-19 declaration under Section 564(b)(1) of the Act, 21 U.S.C. section 360bbb-3(b)(1), unless the authorization is terminated or revoked.  Performed at [facility], [address] [place], [place] [address]    CT ABDOMEN PELVIS W CONTRAST  Result Date: 12/26/2020 CLINICAL DATA:  25 year old male with abdominal pain. EXAM: CT ABDOMEN AND PELVIS WITH CONTRAST TECHNIQUE: Multidetector  CT imaging of the abdomen and pelvis was performed using the standard protocol following bolus administration of intravenous contrast. CONTRAST:  OMNIPAQUE IOHEXOL 300 MG/ML  SOLN COMPARISON:  Abdominal ultrasound 12/28/2009. FINDINGS: Lower chest: The visualized lung bases are clear. No intra-abdominal free air or free fluid. Hepatobiliary: Faint ill-defined subcentimeter right hepatic hypodense lesion is too small to characterize. The liver is otherwise unremarkable. No intrahepatic biliary ductal dilatation. The gallbladder is unremarkable. Pancreas: Unremarkable. No pancreatic ductal dilatation or surrounding inflammatory changes. Spleen: Normal in size without focal abnormality. Adrenals/Urinary Tract: The adrenal glands unremarkable. Kidneys, visualized ureters, and urinary bladder unremarkable. Stomach/Bowel: There is no bowel obstruction. The appendix is mildly thickened measuring approximately 1 cm in thickness. There is periappendiceal stranding. Findings most concerning for an early acute appendicitis. Clinical correlation recommended. No abscess or perforation. The appendix is located the right lower quadrant posterior to the cecum. Vascular/Lymphatic: The  abdominal aorta and IVC are unremarkable. No venous gas there is no adenopathy. Reproductive: The prostate and seminal vesicles are grossly. No pelvic mass. Other: Small fat containing umbilical hernia. Musculoskeletal: No acute or significant osseous findings. IMPRESSION: Findings most concerning for an early acute appendicitis. No abscess or perforation. Electronically Signed   By: Elgie Collard M.D.   On: 12/26/2020 00:21    Review of Systems  Gastrointestinal:  Positive for abdominal pain.  All other systems reviewed and are negative.  Blood pressure (!) 130/99, pulse 83, temperature 98.2 F (36.8 C), temperature source Oral, resp. rate 18, height 6\' 2"  (1.88 m), weight 113.4 kg, SpO2 98 %. Physical Exam Constitutional:      Appearance: He is well-developed.  HENT:     Head: Normocephalic and atraumatic.  Eyes:     General: No scleral icterus.    Pupils: Pupils are equal, round, and reactive to light.  Cardiovascular:     Rate and Rhythm: Normal rate and regular rhythm.  Pulmonary:     Effort: Pulmonary effort is normal.     Breath sounds: Normal breath sounds.  Abdominal:     General: Bowel sounds are normal. There is no distension.     Tenderness: There is abdominal tenderness in the right lower quadrant.  Skin:    General: Skin is warm.     Capillary Refill: Capillary refill takes less than 2 seconds.  Neurological:     General: No focal deficit present.     Mental Status: He is alert.     Assessment/Plan Acute appendicitis -he appears to have appendicitis clinically and radiologically although time course not completely normal.   -I recommended lap appy (not observation) and we discussed risks, recovery  -will proceed today and plan for dc home if all goes well  , MD 12/26/2020, 10:49 AM

## 2020-12-27 ENCOUNTER — Encounter (HOSPITAL_COMMUNITY): Payer: Self-pay | Admitting: General Surgery

## 2020-12-27 LAB — SURGICAL PATHOLOGY

## 2022-10-03 IMAGING — CT CT ABD-PELV W/ CM
2 of 4 series · 16 of 46 positions shown, 18 images · IV contrast (omnipaque)
Comparison: Abdominal ultrasound 12/28/2009.

CLINICAL DATA: 25-year-old male with abdominal pain.

EXAM:
CT ABDOMEN AND PELVIS WITH CONTRAST
TECHNIQUE: Multidetector CT imaging of the abdomen and pelvis was performed
using the standard protocol following bolus administration of
intravenous contrast.
CONTRAST:  100mL OMNIPAQUE IOHEXOL 300 MG/ML  SOLN

[Series 2: abd pel w · axial · 0.83mm/px · z∈[-1012,-557]mm · 13 of 101 slices shown, 15 images]
[im 5/101  soft-tissue]
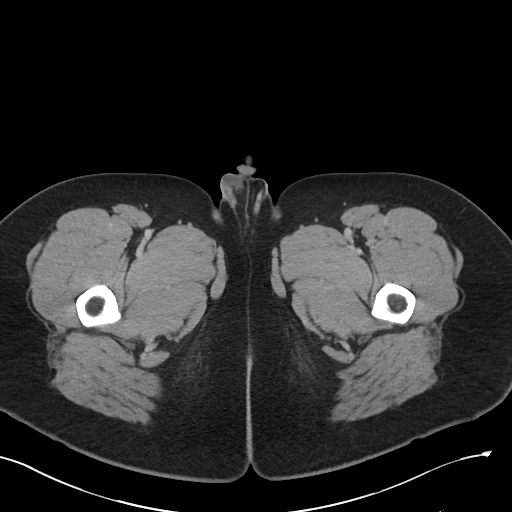
[im 5/101  bone]
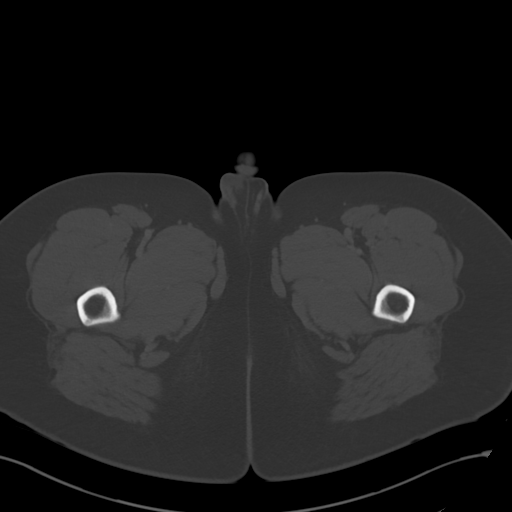
[im 14/101  soft-tissue]
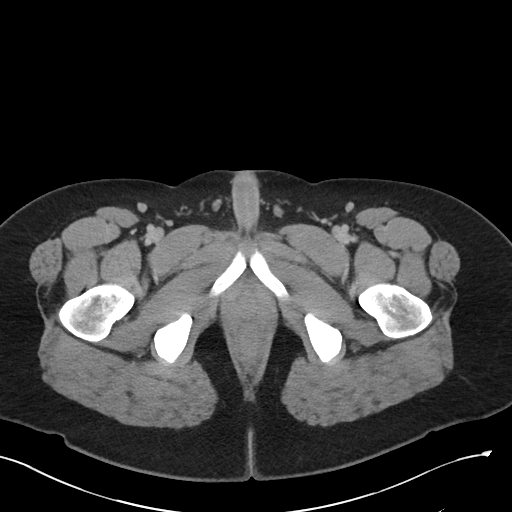
[im 22/101  soft-tissue]
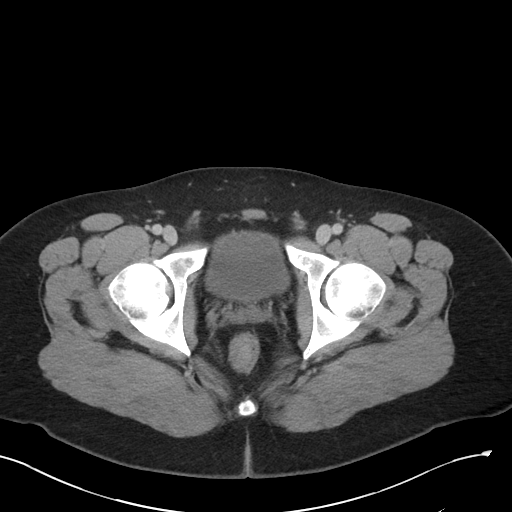
[im 27/101  soft-tissue]
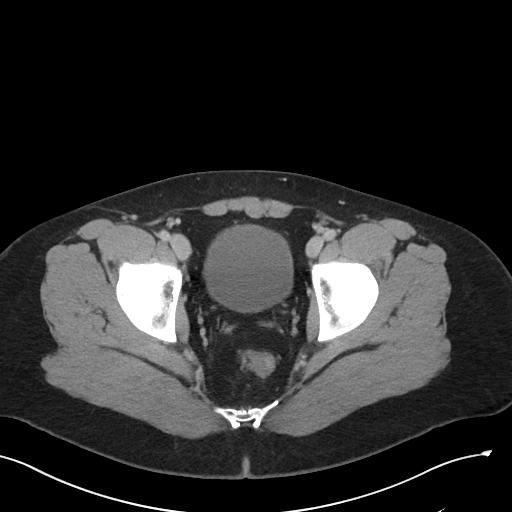
[im 35/101  soft-tissue]
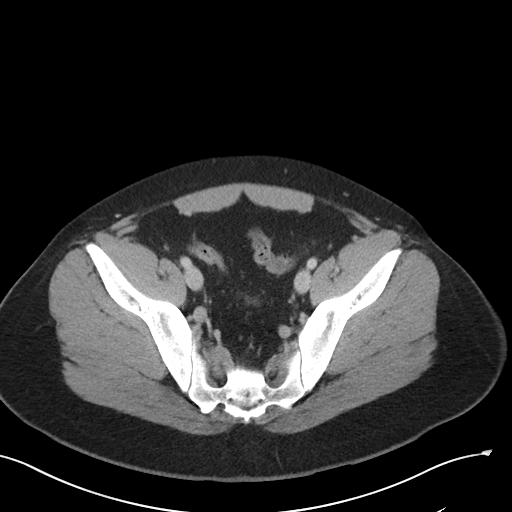
[im 44/101  soft-tissue]
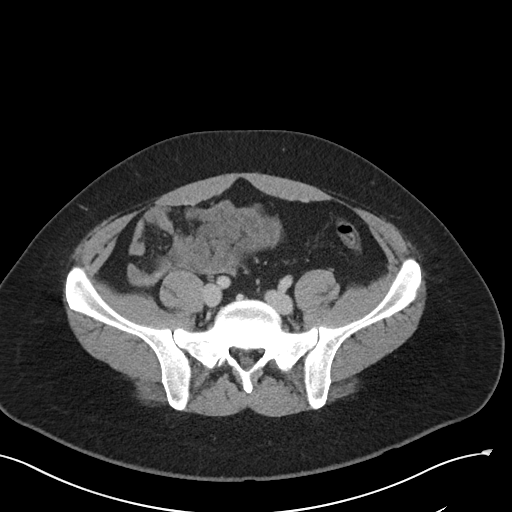
[im 53/101  soft-tissue]
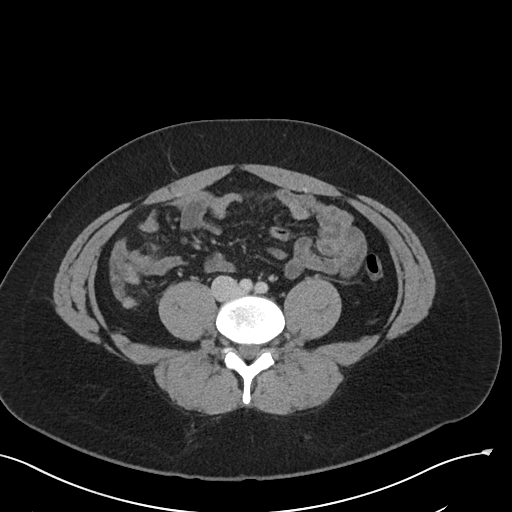
[im 57/101  soft-tissue]
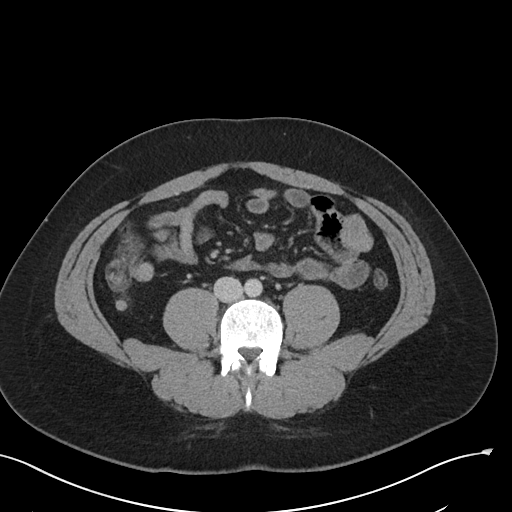
[im 66/101  soft-tissue]
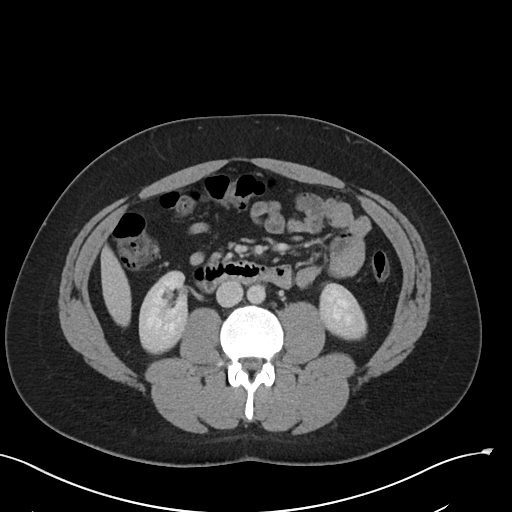
[im 66/101  bone]
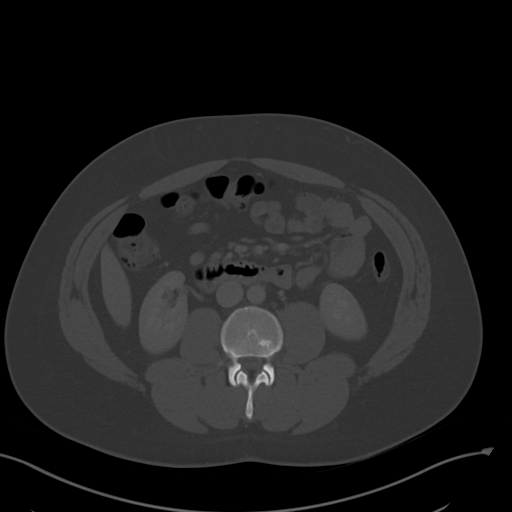
[im 74/101  soft-tissue]
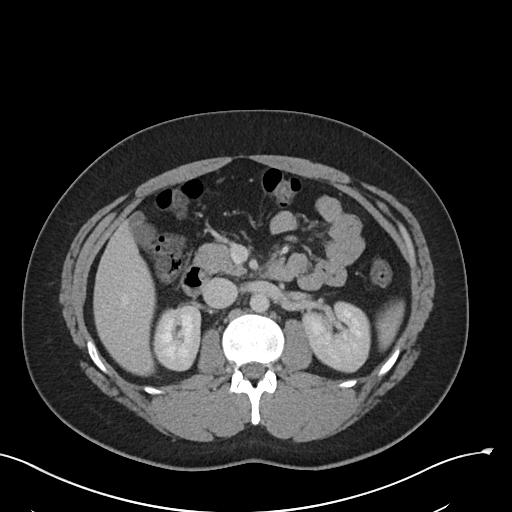
[im 79/101  soft-tissue]
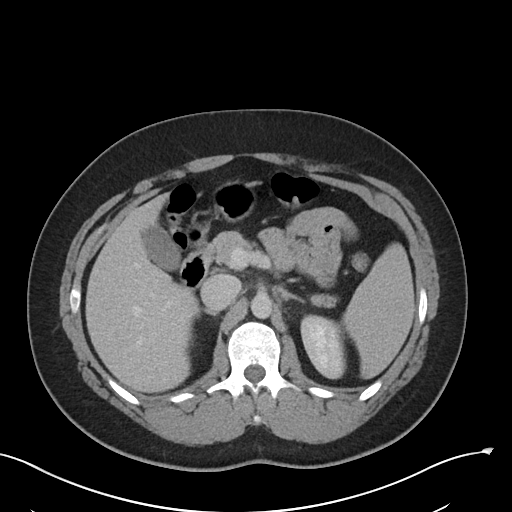
[im 87/101  soft-tissue]
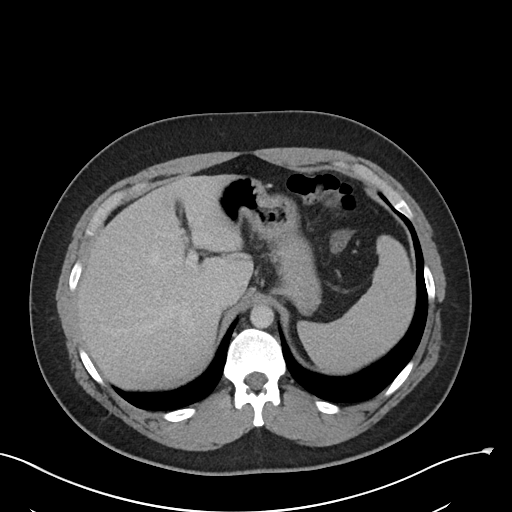
[im 96/101  soft-tissue]
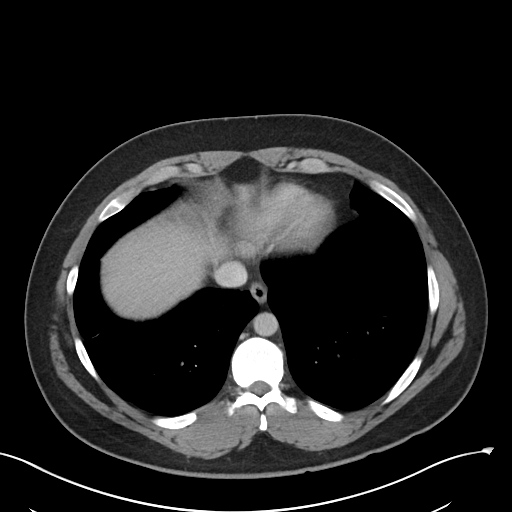

[Series 5: coronal · coronal · 0.80mm/px · 3 of 99 slices shown]
[im 33/99  soft-tissue]
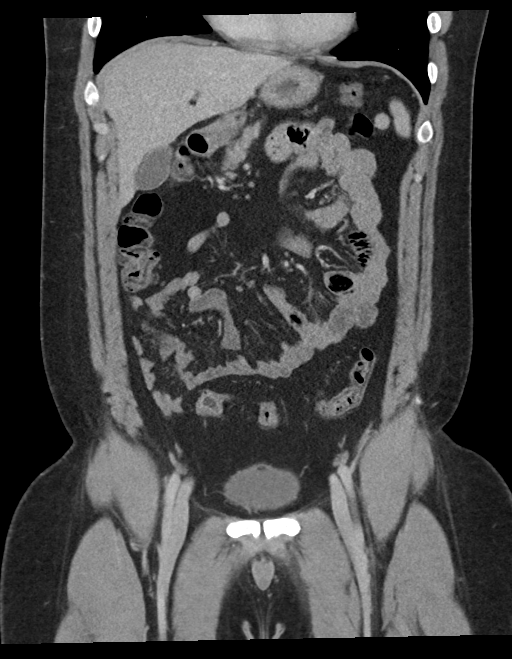
[im 44/99  soft-tissue]
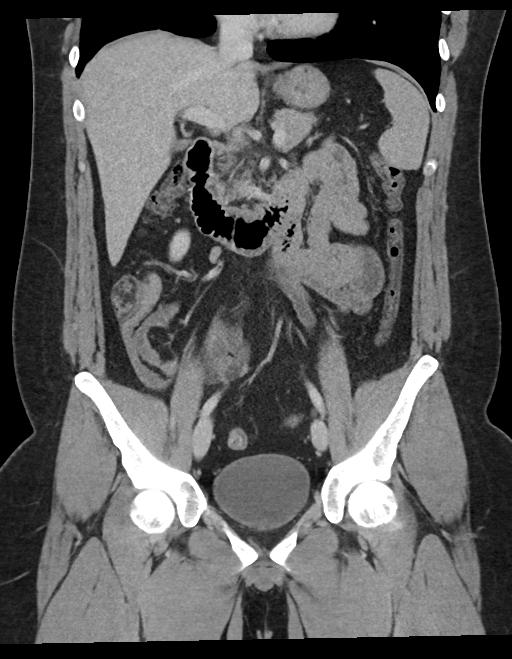
[im 55/99  soft-tissue]
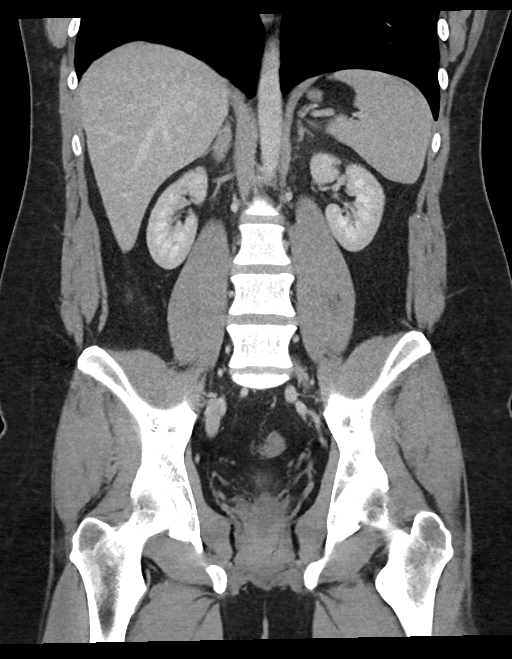

[16 of 46 positions shown; findings below may reference images not displayed]

FINDINGS: Lower chest: The visualized lung bases are clear.

No intra-abdominal free air or free fluid.

Hepatobiliary: Faint ill-defined subcentimeter right hepatic
hypodense lesion is too small to characterize. The liver is
otherwise unremarkable. No intrahepatic biliary ductal dilatation.
The gallbladder is unremarkable.

Pancreas: Unremarkable. No pancreatic ductal dilatation or
surrounding inflammatory changes.

Spleen: Normal in size without focal abnormality.

Adrenals/Urinary Tract: The adrenal glands unremarkable. Kidneys,
visualized ureters, and urinary bladder unremarkable.

Stomach/Bowel: There is no bowel obstruction. The appendix is mildly
thickened measuring approximately 1 cm in thickness. There is
periappendiceal stranding. Findings most concerning for an early
acute appendicitis. Clinical correlation recommended. No abscess or
perforation. The appendix is located the right lower quadrant
posterior to the cecum.

Vascular/Lymphatic: The abdominal aorta and IVC are unremarkable. No
venous gas there is no adenopathy.

Reproductive: The prostate and seminal vesicles are grossly. No
pelvic mass.

Other: Small fat containing umbilical hernia.

Musculoskeletal: No acute or significant osseous findings.
IMPRESSION: Findings most concerning for an early acute appendicitis. No abscess
or perforation.
